# Patient Record
Sex: Female | Born: 1980 | Race: Black or African American | Hispanic: No | Marital: Married | Smoking: Never smoker
Health system: Southern US, Community
[De-identification: ages and names within clinical notes are randomized; demographics above are authoritative.]

## PROBLEM LIST (undated history)

## (undated) DIAGNOSIS — L03221 Cellulitis of neck: Secondary | ICD-10-CM

## (undated) HISTORY — DX: Cellulitis of neck: L03.221

---

## 2005-06-20 ENCOUNTER — Inpatient Hospital Stay: Payer: Self-pay

## 2010-07-11 ENCOUNTER — Ambulatory Visit: Payer: Self-pay | Admitting: Family Medicine

## 2012-07-13 ENCOUNTER — Ambulatory Visit: Payer: Self-pay

## 2012-07-23 DIAGNOSIS — L03019 Cellulitis of unspecified finger: Secondary | ICD-10-CM

## 2012-07-23 DIAGNOSIS — L02519 Cutaneous abscess of unspecified hand: Secondary | ICD-10-CM | POA: Insufficient documentation

## 2014-07-19 ENCOUNTER — Other Ambulatory Visit: Payer: Self-pay | Admitting: Obstetrics and Gynecology

## 2014-07-19 DIAGNOSIS — Z1231 Encounter for screening mammogram for malignant neoplasm of breast: Secondary | ICD-10-CM

## 2014-08-03 ENCOUNTER — Other Ambulatory Visit: Payer: Self-pay | Admitting: Obstetrics and Gynecology

## 2014-08-03 ENCOUNTER — Ambulatory Visit
Admission: RE | Admit: 2014-08-03 | Discharge: 2014-08-03 | Disposition: A | Discharge status: Home / Self Care | Payer: Managed Care, Other (non HMO) | Source: Ambulatory Visit | Attending: Obstetrics and Gynecology | Admitting: Obstetrics and Gynecology

## 2014-08-03 ENCOUNTER — Ambulatory Visit
Admission: RE | Admit: 2014-08-03 | Discharge: 2014-08-03 | Disposition: A | Discharge status: Home / Self Care | Payer: Managed Care, Other (non HMO) | Source: Ambulatory Visit | Attending: Nurse Practitioner | Admitting: Nurse Practitioner

## 2014-08-03 DIAGNOSIS — Z1231 Encounter for screening mammogram for malignant neoplasm of breast: Secondary | ICD-10-CM

## 2017-03-26 ENCOUNTER — Other Ambulatory Visit: Payer: Self-pay | Admitting: Family Medicine

## 2017-03-26 DIAGNOSIS — R221 Localized swelling, mass and lump, neck: Secondary | ICD-10-CM

## 2017-04-08 ENCOUNTER — Ambulatory Visit
Admission: RE | Admit: 2017-04-08 | Discharge: 2017-04-08 | Disposition: A | Discharge status: Home / Self Care | Payer: Commercial Managed Care - PPO | Source: Ambulatory Visit | Attending: Family Medicine | Admitting: Family Medicine

## 2017-04-08 DIAGNOSIS — R221 Localized swelling, mass and lump, neck: Secondary | ICD-10-CM | POA: Insufficient documentation

## 2017-04-08 DIAGNOSIS — Z0000 Encounter for general adult medical examination without abnormal findings: Secondary | ICD-10-CM | POA: Insufficient documentation

## 2017-04-15 ENCOUNTER — Other Ambulatory Visit: Payer: Self-pay | Admitting: Family Medicine

## 2017-04-15 DIAGNOSIS — R221 Localized swelling, mass and lump, neck: Secondary | ICD-10-CM

## 2017-04-24 ENCOUNTER — Ambulatory Visit
Admission: RE | Admit: 2017-04-24 | Discharge: 2017-04-24 | Disposition: A | Discharge status: Home / Self Care | Payer: Commercial Managed Care - PPO | Source: Ambulatory Visit | Attending: Family Medicine | Admitting: Family Medicine

## 2017-04-24 DIAGNOSIS — R221 Localized swelling, mass and lump, neck: Secondary | ICD-10-CM | POA: Diagnosis present

## 2017-04-24 DIAGNOSIS — I871 Compression of vein: Secondary | ICD-10-CM | POA: Insufficient documentation

## 2017-04-24 MED ORDER — IOPAMIDOL (ISOVUE-300) INJECTION 61%
75.0000 mL | Freq: Once | INTRAVENOUS | Status: AC | PRN
  Administered 2017-04-24: 75 mL via INTRAVENOUS

## 2017-05-02 ENCOUNTER — Telehealth: Payer: Self-pay | Admitting: Surgery

## 2017-05-02 NOTE — Telephone Encounter (Signed)
Left a voicemail for the patient to call our office, needs an appointment with Dr. Rosana Hoes next week.

## 2017-05-02 NOTE — Telephone Encounter (Signed)
-----   Message from Vickie Epley, MD sent at 05/01/2017 11:18 PM EST ----- Regarding: FW: Patient with neck mass Stephanie Stafford from [facility] asked if I can see this patient he is scheduled to see on Monday. Can we schedule and call her?  She needs neck MRI and possibly Left neck venous duplex, but we can schedule at appointment. Please let me know if any questions.  Thank you!            - Corene Cornea   ----- Message ----- From: Stephanie Pun, MD Sent: 05/01/2017   3:00 PM To: Vickie Epley, MD Subject: Patient with neck mass                         Patient with neck mass

## 2017-05-05 NOTE — Telephone Encounter (Signed)
Left message on patients voice mail to call office regarding scheduling appointment for neck mass.

## 2017-05-06 NOTE — Telephone Encounter (Signed)
Left another message on patients voicemail to call the office to setup an appointment to be seen.

## 2017-05-07 NOTE — Telephone Encounter (Signed)
Patients coming in on 05/16/17

## 2017-05-16 ENCOUNTER — Ambulatory Visit (INDEPENDENT_AMBULATORY_CARE_PROVIDER_SITE_OTHER): Payer: Commercial Managed Care - PPO | Admitting: Surgery

## 2017-05-16 ENCOUNTER — Encounter: Payer: Self-pay | Admitting: Surgery

## 2017-05-16 VITALS — BP 139/87 | HR 76 | Temp 98.6°F | Ht 63.0 in | Wt 161.0 lb

## 2017-05-16 DIAGNOSIS — R221 Localized swelling, mass and lump, neck: Secondary | ICD-10-CM

## 2017-05-16 NOTE — Patient Instructions (Addendum)
We will contact you with an appointment for further testing prior to having your surgery with Dr. Rosana Hoes.  Once these have been completed we will see you back in office to discuss result and plan for surgery.   If you do not hear from our office within the next 2-3 business days please give our office a call.  If you have any questions or concerns please give our office a call as well.

## 2017-05-16 NOTE — Progress Notes (Signed)
Surgical Clinic History and Physical  Referring provider:  No referring provider defined for this encounter.  HISTORY OF PRESENT ILLNESS (HPI):  37 y.o. otherwise healthy female presents for evaluation of Left neck mass. Patient reports she first noticed the otherwise non-tender and asymptomatic mass 3 - 4 months ago, since which time it has grown to be more noticeable. Patient has since underwent ultrasound and CT imaging and denies fever/chills, weight loss, palpitations, lightheadedness, dizziness, dysphasia, voice changes, CP, or SOB.  PAST MEDICAL HISTORY (PMH):  Past Medical History:  Diagnosis Date  . Cellulitis of neck      PAST SURGICAL HISTORY ([place]):  Past Surgical History:  Procedure Laterality Date  . CESAREAN SECTION       MEDICATIONS:  Prior to Admission medications   Not on File     ALLERGIES:  No Known Allergies   SOCIAL HISTORY:  Social History   Socioeconomic History  . Marital status: Married    Spouse name: Not on file  . Number of children: Not on file  . Years of education: Not on file  . Highest education level: Not on file  Social Needs  . Financial resource strain: Not on file  . Food insecurity - worry: Not on file  . Food insecurity - inability: Not on file  . Transportation needs - medical: Not on file  . Transportation needs - non-medical: Not on file  Occupational History  . Not on file  Tobacco Use  . Smoking status: Never Smoker  . Smokeless tobacco: Never Used  Substance and Sexual Activity  . Alcohol use: No    Frequency: Never  . Drug use: No  . Sexual activity: Not on file  Other Topics Concern  . Not on file  Social History Narrative  . Not on file    The patient currently resides (home / rehab facility / nursing home): Home The patient normally is (ambulatory / bedbound): Ambulatory  FAMILY HISTORY:  Family History  Problem Relation Age of Onset  . Breast cancer Maternal Aunt   . Healthy Mother   . Thyroid  disease Brother   . Lung cancer Maternal Grandfather     Otherwise negative/non-contributory.  REVIEW OF SYSTEMS:  Constitutional: denies any other weight loss, fever, chills, or sweats  Eyes: denies any other vision changes, history of eye injury  ENT: denies sore throat, hearing problems  Respiratory: denies shortness of breath, wheezing  Cardiovascular: denies chest pain, palpitations  Gastrointestinal: denies abdominal pain, N/V, or diarrhea Musculoskeletal: denies any other joint pains or cramps  Skin: Denies any other rashes or skin discolorations Neurological: denies any other headache, dizziness, weakness  Psychiatric: Denies any other depression, anxiety   All other review of systems were otherwise negative   VITAL SIGNS:  BP 139/87   Pulse 76   Temp 98.6 F (37 C) (Oral)   Ht 5\' 3"  (1.6 m)   Wt 161 lb (73 kg)   BMI 28.52 kg/m    PHYSICAL EXAM:  Constitutional:  -- Normal body habitus  -- Awake, alert, and oriented x3  Eyes:  -- Pupils equally round and reactive to light  -- No scleral icterus  Ear, nose, throat:  -- No jugular venous distension -- Easily palpable non-tender 2 - 3 cm Left neck mass anterior to and mid-way along the patient's SCM -- No nasal drainage, bleeding Pulmonary:  -- No crackles  -- Equal breath sounds bilaterally -- Breathing non-labored at rest Cardiovascular:  -- S1, S2 present  --  No pericardial rubs  Gastrointestinal:  -- Abdomen soft, nontender, non-distended, no guarding/rebound  -- No abdominal masses appreciated, pulsatile or otherwise  Musculoskeletal and Integumentary:  -- Wounds or skin discoloration: None appreciated -- Extremities: B/L UE and LE FROM, hands and feet warm, no edema  Neurologic:  -- CN II - XII grossly intact and symmetric -- Motor function: Intact and symmetric -- Sensation: Intact and symmetric  Labs: CBC: No results found for: WBC, RBC BMP: No results found for: GLUCOSE, POTASSIUM, CHLORIDE,  CO2, BUN, CREATININE, CALCIUM   Imaging studies:  Left Neck Soft Tissue Ultrasound (04/08/2017) The palpable mass corresponds to a 2.4 x 3.8 x 1.7 cm slightly hypoechoic, and ill-defined mass in the subcutaneous fat. Color Doppler imaging demonstrates some internal vascularity.  CT Neck with Contrast (04/24/2017) - personally reviewed and discussed with patient at length 1. Soft tissue mass of the left carotid space extending from approximately the C1 level the inferiorly to the level of the thyroid cartilage. Primary differential considerations based on the location are schwannoma and, less likely, paraganglioma. In the absence of associated pain, carotidynia and vasculitis are unlikely. MRI of the neck with and without contrast is recommended. 2. Compression or occlusion of the left internal jugular vein by the above-described mass. 3. No other neck mass.  No lymphadenopathy.  Assessment/Plan:  37 y.o. 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   - will check neck MRI with and without contrast   - also will check Left neck venous doppler to assess for DVT (compression vs thrombosis)   - direct laryngoscopic vocal cord evaluation by ENT requested pre-operatively to assess function  - general preliminary risks, benefits, and alternatives to surgical management of Left neck mass, particularly for anticipated schwannoma, were discussed with the patient, and all of her questions were answered to her expressed satisfaction, though will defer consent until follow-up imaging and laryngoscopy   - will further discuss and anticipate scheduling surgical resection/enucleation with intra-operative nerve monitoring at follow-up appointment to discuss results of further imaging and workup  - return to clinic upon completion of above further workup  -  instructed to call if any questions or concerns  All of the above recommendations were discussed with the patient, and all of patient's questions were answered to her expressed satisfaction.  Thank you for the opportunity to participate in this patient's care.  -- Marilynne Drivers Rosana Hoes, MD, [place]: [facility] - Partnering for exceptional care. Office: [phone]

## 2017-05-19 ENCOUNTER — Telehealth: Payer: Self-pay

## 2017-05-19 NOTE — Telephone Encounter (Signed)
Called patient but had to leave her a voicemail letting her know of her appointments. I will send her the information by mail as well.

## 2017-05-19 NOTE — Addendum Note (Signed)
Addended by: Wayna Chalet on: 05/19/2017 08:55 AM   Modules accepted: Orders

## 2017-05-20 NOTE — Addendum Note (Signed)
Addended by: Wayna Chalet on: 05/20/2017 08:22 AM   Modules accepted: Orders

## 2017-05-22 ENCOUNTER — Ambulatory Visit: Payer: Commercial Managed Care - PPO

## 2017-05-22 ENCOUNTER — Telehealth: Payer: Self-pay

## 2017-05-22 NOTE — Telephone Encounter (Signed)
Patient called and left a message on our answering machine stating that she is not able to miss work.  Georgina Peer then called patient back and gave her the number to call central scheduling to reschedule. Patient stated that she would call.

## 2017-05-27 NOTE — Telephone Encounter (Signed)
Error

## 2017-05-28 ENCOUNTER — Ambulatory Visit: Payer: Commercial Managed Care - PPO

## 2017-05-28 ENCOUNTER — Ambulatory Visit: Payer: Self-pay | Admitting: Surgery

## 2017-06-02 ENCOUNTER — Ambulatory Visit
Admission: RE | Admit: 2017-06-02 | Discharge: 2017-06-02 | Disposition: A | Discharge status: Home / Self Care | Payer: Commercial Managed Care - PPO | Source: Ambulatory Visit | Attending: Surgery | Admitting: Surgery

## 2017-06-02 DIAGNOSIS — R221 Localized swelling, mass and lump, neck: Secondary | ICD-10-CM

## 2017-06-02 MED ORDER — GADOBENATE DIMEGLUMINE 529 MG/ML IV SOLN
15.0000 mL | Freq: Once | INTRAVENOUS | Status: AC | PRN
  Administered 2017-06-02: 15 mL via INTRAVENOUS

## 2017-06-03 ENCOUNTER — Telehealth: Payer: Self-pay

## 2017-06-03 NOTE — Telephone Encounter (Signed)
Called patient but had to leave her a voicemail to return my call. Patient needs to see Dr. Rosana Hoes.  If patient calls, please schedule her an appointment with Dr. Rosana Hoes for 06/04/2017 at 8:45 AM to discuss MRI and U/S results. Thanks.

## 2017-06-03 NOTE — Telephone Encounter (Signed)
Spoke with patient and told her we needed to see her tomorrow, patient stated she wouldn't be able to take off of work with very short notice, patient said she would call back to schedule appointment.

## 2017-06-03 NOTE — Telephone Encounter (Signed)
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 I can follow up with her to discuss results, but she needs to be referred for ultrasound-guided biopsy (IR) and referred to either ENT or [place] if they're comfortable/interested or more likely [facility] or [facility].  Please let me know if any questions. Thank you.          Corene Cornea

## 2017-06-05 ENCOUNTER — Telehealth: Payer: Self-pay | Admitting: Surgery

## 2017-06-05 NOTE — Telephone Encounter (Signed)
I have contacted [facility] to follow up on patient's referral. Patient was given an appointment to be seen on 3/27. Patient did not attend appointment. [facility] has mailed the patient a letter. I have also called patient and left a message on voicemail to call [facility] to reschedule.

## 2017-06-10 NOTE — Telephone Encounter (Signed)
Patient is coming in tomorrow to see Dr. Rosana Hoes to discuss her images results.

## 2017-06-11 ENCOUNTER — Telehealth: Payer: Self-pay

## 2017-06-11 ENCOUNTER — Other Ambulatory Visit: Payer: Self-pay | Admitting: Internal Medicine

## 2017-06-11 ENCOUNTER — Encounter: Payer: Self-pay | Admitting: Surgery

## 2017-06-11 ENCOUNTER — Ambulatory Visit (INDEPENDENT_AMBULATORY_CARE_PROVIDER_SITE_OTHER): Payer: Commercial Managed Care - PPO | Admitting: Surgery

## 2017-06-11 VITALS — BP 139/88 | HR 79 | Temp 98.4°F | Ht 63.0 in | Wt 158.4 lb

## 2017-06-11 DIAGNOSIS — R221 Localized swelling, mass and lump, neck: Secondary | ICD-10-CM | POA: Diagnosis not present

## 2017-06-11 NOTE — Telephone Encounter (Signed)
Order placed for IR neck mass biopsy. IMG [id number]. Spoke with Pamala Hurry in scheduling and the request has been sent for review. She will call me back and let me know when the appointment will be.

## 2017-06-11 NOTE — Progress Notes (Signed)
Surgical Clinic Progress/Follow-up Note   HPI:  37 y.o. Female presents to clinic for post-op follow-up evaluation of Left neck mass and discussion regarding recent MRI results. Patient continues to report essentially asymptomatic Left neck mass, denies any Left neck pain, dysphasia, altered voice, N/V, facial swelling, paresthesias, fever/chills, CP, or SOB.  Review of Systems:  Constitutional: denies any other weight loss, fever, chills, or sweats  Eyes: denies any other vision changes, history of eye injury  ENT: denies sore throat, hearing problems  Respiratory: denies shortness of breath, wheezing  Cardiovascular: denies chest pain, palpitations  Gastrointestinal: denies abdominal pain, N/V, or diarrhea Musculoskeletal: denies any other joint pains or cramps  Skin: Denies any other rashes or skin discolorations  Neurological: denies any other headache, dizziness, weakness  Psychiatric: denies any other depression, anxiety  All other review of systems: otherwise negative   Vital Signs:  BP 139/88   Pulse 79   Temp 98.4 F (36.9 C) (Oral)   Ht 5\' 3"  (1.6 m)   Wt 158 lb 6.4 oz (71.8 kg)   LMP 05/12/2017   BMI 28.06 kg/m    Physical Exam:  Constitutional:  -- Normal body habitus  -- Awake, alert, and oriented x3  Eyes:  -- Pupils equally round and reactive to light  -- No scleral icterus  Ear, nose, throat:  -- No jugular venous distension -- Firm non-tender non-pulsatile Left neck mass, uncertain whether changed in size -- No nasal drainage, bleeding Pulmonary:  -- No crackles -- Equal breath sounds bilaterally -- Breathing non-labored at rest Cardiovascular:  -- S1, S2 present  -- No pericardial rubs  Gastrointestinal:  -- Soft, nontender, non-distended, no guarding/rebound  -- No abdominal masses appreciated, pulsatile or otherwise  Musculoskeletal / Integumentary:  -- Wounds or skin discoloration: None appreciated  -- Extremities: B/L UE and LE FROM, hands  and feet warm, no edema  Neurologic:  -- Motor function: intact and symmetric  -- Sensation: intact and symmetric   Imaging:  MRI Neck with and without Contrast (06/02/2017) - images and interpretation personally reviewed with patient and her boyfriend/husband 9 cm left neck mass primarily involving the carotid space as above. This does not have the typical appearance of a schwannoma or paraganglioma. A soft tissue sarcoma and desmoid-type fibromatosis are considerations, and tissue sampling is recommended. The internal jugular vein is either completely compressed or directly involved by the mass.   Assessment:  37 y.o. yo Female with a problem list including...  Patient Active Problem List   Diagnosis Date Noted  . Cellulitis and abscess of finger 07/23/2012    presents to clinic for essentially asymptomatic Left neck mass, concerning on recent MRI for sarcoma.  Plan:   - results of MRI reviewed with patient and her husband/boyfried  - image-guided biopsy of Left neck mass advised and will schedule  - further management and follow-up to be determined following above  - all of patient's and her boyfriend/husband's questioned answered  - instructed to call office if any questions or concerns  - will also plan to discuss at tumor board tomorrow  All of the above recommendations were discussed with the patient and patient's family, and all of patient's and family's questions were answered to their expressed satisfaction.  -- Marilynne Drivers Rosana Hoes, MD, [place]: [facility] - Partnering for exceptional care. Office: [phone]

## 2017-06-11 NOTE — Patient Instructions (Signed)
We will call you later with an appointment time and date to have the biopsy done. If you have not heard from [place] by Friday before lunch please call our office and we can check on this for you.   [phone]

## 2017-06-13 NOTE — Telephone Encounter (Signed)
Patient notified of 06/23/17 @ 10:30 am Neck mass biopsy.  Follow up Dr.Davis 07/02/17 @ 10:30 am.

## 2017-06-13 NOTE — Telephone Encounter (Signed)
Left message for patient to return call.  Neck Mass Biopsy 06/23/17 @ 10:00 [facility] Entrance  Stop at Registration Desk to register.  A nurse will call patient several days prior to having surgery with prep information.

## 2017-06-13 NOTE — Telephone Encounter (Signed)
Left message for patient to return call regarding Neck mass biopsy. Spoke to [place] and it was discussed at tumor board and she is waiting on Radiologist to let her know when it can be scheduled.

## 2017-06-20 ENCOUNTER — Other Ambulatory Visit: Payer: Self-pay | Admitting: Radiology

## 2017-06-23 ENCOUNTER — Ambulatory Visit
Admission: RE | Admit: 2017-06-23 | Discharge: 2017-06-23 | Disposition: A | Discharge status: Home / Self Care | Payer: Commercial Managed Care - PPO | Source: Ambulatory Visit | Attending: Surgery | Admitting: Surgery

## 2017-06-23 ENCOUNTER — Other Ambulatory Visit: Payer: Self-pay | Admitting: Surgery

## 2017-06-23 DIAGNOSIS — R221 Localized swelling, mass and lump, neck: Secondary | ICD-10-CM | POA: Diagnosis present

## 2017-06-23 MED ORDER — SODIUM CHLORIDE 0.9 % IV SOLN: INTRAVENOUS | Status: DC

## 2017-06-23 NOTE — Procedures (Signed)
Interventional Radiology Procedure Note  Procedure: Patient presents with left neck mass of the carotid space, for possible bx.   Findings: real time US shows infiltrative soft tissue of the left carotid space, with internal flow, mixed venous and arterial architecture.  Limited window for biopsy  Discussed with the patient, and my impression is that given the risk of significant hemorrhage and her upcoming referral to [facility], would defer bx for now, possibly for surgical bx.   . Recommendations:  - Bx was deferred at this time, as above findings  Signed,  Dulcy Fanny. Earleen Newport, DO

## 2017-06-30 ENCOUNTER — Telehealth: Payer: Self-pay | Admitting: Surgery

## 2017-06-30 NOTE — Telephone Encounter (Signed)
I have called patient to advise of appointment-No answer. I have left a message on voicemail for patient to call back for information below.   Patient has been referred to [facility] for Neck Biopsy/Consultation.  Appt: 07/04/17 @ 9:00am-Dr Blumberg--[facility]-[address] [place]. Patient will need to arrive 30 minutes earlier and also be mindful of new construction going on. Patient will need to park in the visitors parking deck and the [place] entrance is on the right side of the main campus.   Nevin Bloodgood has sent the provider an email to review patient's clinic notes incase he would like to consult sooner or to schedule a Bx the day of appointment.   Per Paula-they do have access to all clinic notes in Epic and Images.

## 2017-06-30 NOTE — Telephone Encounter (Signed)
Patient has called back and all information has been relayed. Patient was ok with this appointment.

## 2017-06-30 NOTE — Telephone Encounter (Signed)
Please call patient in reference to [facility] referral

## 2017-07-01 ENCOUNTER — Telehealth: Payer: Self-pay

## 2017-07-01 NOTE — Telephone Encounter (Signed)
Left message for patient to call office.  We can cancel appointment for 07/02/17 with Dr.Davis since patient will be seen at [facility] for neck biopsy.

## 2017-07-02 ENCOUNTER — Ambulatory Visit: Payer: Self-pay | Admitting: Surgery

## 2018-12-05 IMAGING — US US SOFT TISSUE HEAD/NECK
1 series · 2 of 2 positions shown · non-contrast
Comparison: MRI 06/02/2017, CT 04/24/2017

CLINICAL DATA: 29-year-old female with a history of left neck mass
referred for possible biopsy

EXAM:
ULTRASOUND OF HEAD/NECK SOFT TISSUES
TECHNIQUE: Ultrasound examination of the head and neck soft tissues was
performed in the area of clinical concern.

[Series 1: us soft tissue head/neck · 2 of 2 slices shown]
[im 1/2]
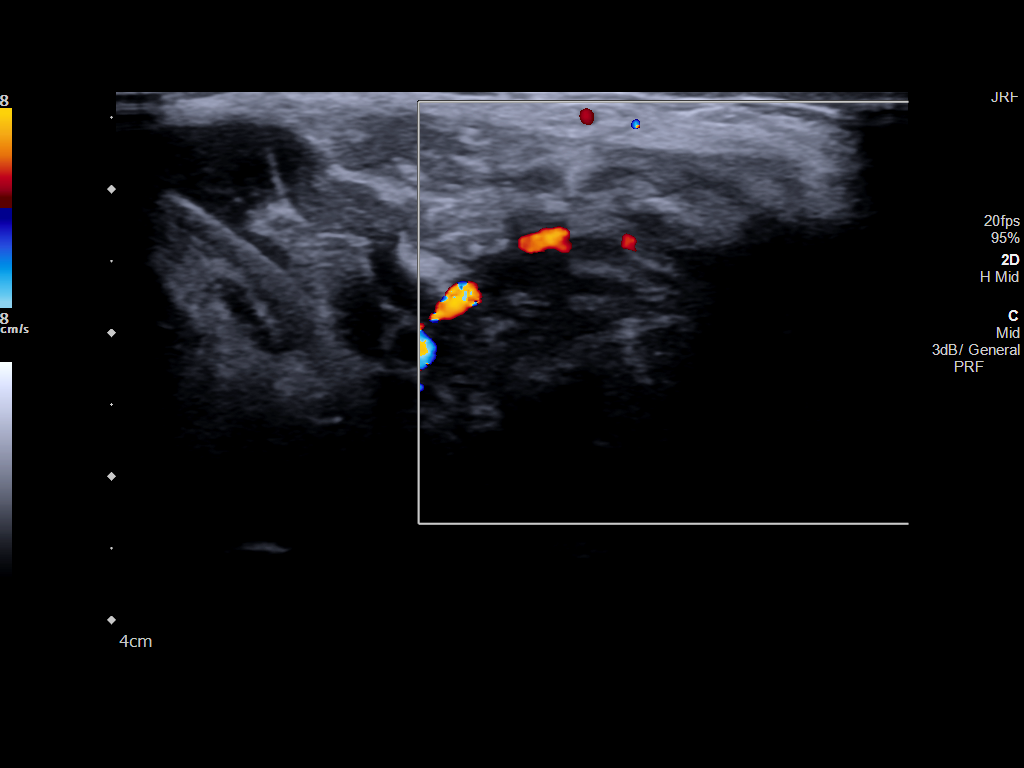
[im 2/2]
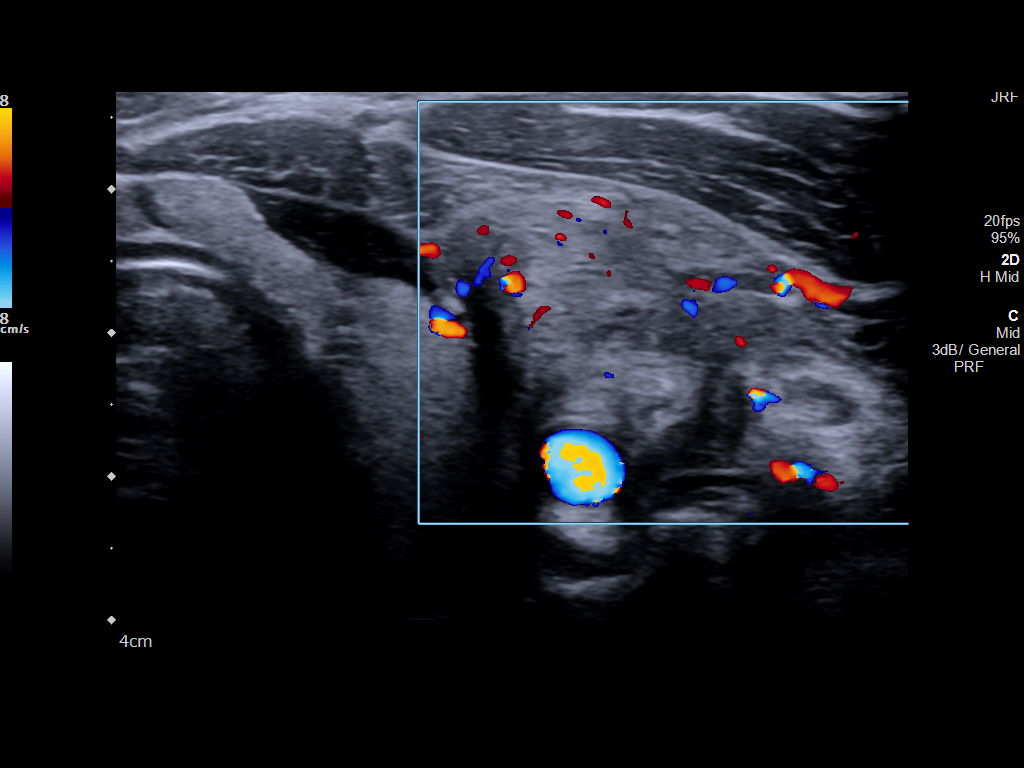

[2 of 2 positions shown; findings below may reference images not displayed]

FINDINGS: Color and grayscale duplex ultrasound performed as a planning study
demonstrates infiltrative soft tissue within the left carotid space
with significant internal venous and arterial signature.

Biopsy was deferred at this time.
IMPRESSION: Soft tissue ultrasound survey for potential percutaneous biopsy
demonstrates infiltrative soft tissue of the left carotid space with
internal venous and arterial signature. Biopsy deferred at this
time.

## 2021-12-11 ENCOUNTER — Emergency Department (HOSPITAL_COMMUNITY)
Admission: EM | Admit: 2021-12-11 | Discharge: 2021-12-11 | Disposition: A | Discharge status: Home / Self Care | Payer: Commercial Managed Care - PPO | Attending: Emergency Medicine | Admitting: Emergency Medicine

## 2021-12-11 ENCOUNTER — Emergency Department (HOSPITAL_COMMUNITY): Payer: Commercial Managed Care - PPO

## 2021-12-11 ENCOUNTER — Encounter (HOSPITAL_COMMUNITY): Payer: Self-pay

## 2021-12-11 ENCOUNTER — Other Ambulatory Visit: Payer: Self-pay

## 2021-12-11 DIAGNOSIS — R030 Elevated blood-pressure reading, without diagnosis of hypertension: Secondary | ICD-10-CM | POA: Diagnosis not present

## 2021-12-11 DIAGNOSIS — D259 Leiomyoma of uterus, unspecified: Secondary | ICD-10-CM | POA: Insufficient documentation

## 2021-12-11 DIAGNOSIS — N3001 Acute cystitis with hematuria: Secondary | ICD-10-CM

## 2021-12-11 DIAGNOSIS — R1031 Right lower quadrant pain: Secondary | ICD-10-CM | POA: Diagnosis present

## 2021-12-11 DIAGNOSIS — D219 Benign neoplasm of connective and other soft tissue, unspecified: Secondary | ICD-10-CM

## 2021-12-11 DIAGNOSIS — N83201 Unspecified ovarian cyst, right side: Secondary | ICD-10-CM | POA: Diagnosis not present

## 2021-12-11 DIAGNOSIS — R911 Solitary pulmonary nodule: Secondary | ICD-10-CM

## 2021-12-11 DIAGNOSIS — Z20822 Contact with and (suspected) exposure to covid-19: Secondary | ICD-10-CM | POA: Insufficient documentation

## 2021-12-11 DIAGNOSIS — N838 Other noninflammatory disorders of ovary, fallopian tube and broad ligament: Secondary | ICD-10-CM

## 2021-12-11 LAB — COMPREHENSIVE METABOLIC PANEL
ALT: 22 U/L (ref 0–44)
AST: 19 U/L (ref 15–41)
Albumin: 3.8 g/dL (ref 3.5–5.0)
Alkaline Phosphatase: 51 U/L (ref 38–126)
Anion gap: 5 (ref 5–15)
BUN: 9 mg/dL (ref 6–20)
CO2: 25 mmol/L (ref 22–32)
Calcium: 8.8 mg/dL — ABNORMAL LOW (ref 8.9–10.3)
Chloride: 105 mmol/L (ref 98–111)
Creatinine, Ser: 0.77 mg/dL (ref 0.44–1.00)
GFR, Estimated: >60 mL/min (ref >60)
Glucose, Bld: 105 mg/dL — ABNORMAL HIGH (ref 70–99)
Potassium: 4 mmol/L (ref 3.5–5.1)
Sodium: 135 mmol/L (ref 135–145)
Total Bilirubin: 0.5 mg/dL (ref 0.3–1.2)
Total Protein: 8 g/dL (ref 6.5–8.1)

## 2021-12-11 LAB — CBC
HCT: 39.3 % (ref 36.0–46.0)
Hemoglobin: 12.8 g/dL (ref 12.0–15.0)
MCH: 28.1 pg (ref 26.0–34.0)
MCHC: 32.6 g/dL (ref 30.0–36.0)
MCV: 86.2 fL (ref 80.0–100.0)
Platelets: 279 10*3/uL (ref 150–400)
RBC: 4.56 MIL/uL (ref 3.87–5.11)
RDW: 12.9 % (ref 11.5–15.5)
WBC: 7.7 10*3/uL (ref 4.0–10.5)
nRBC: 0 % (ref 0.0–0.2)

## 2021-12-11 LAB — URINALYSIS, ROUTINE W REFLEX MICROSCOPIC
Bilirubin Urine: NEGATIVE
Glucose, UA: NEGATIVE mg/dL
Ketones, ur: NEGATIVE mg/dL
Nitrite: NEGATIVE
Protein, ur: NEGATIVE mg/dL
Specific Gravity, Urine: 1.011 (ref 1.005–1.030)
pH: 6 (ref 5.0–8.0)

## 2021-12-11 LAB — I-STAT BETA HCG BLOOD, ED (MC, WL, AP ONLY): I-stat hCG, quantitative: <5 m[IU]/mL (ref <5)

## 2021-12-11 LAB — RESP PANEL BY RT-PCR (FLU A&B, COVID) ARPGX2
Influenza A by PCR: NEGATIVE
Influenza B by PCR: NEGATIVE
SARS Coronavirus 2 by RT PCR: NEGATIVE

## 2021-12-11 LAB — LIPASE, BLOOD: Lipase: 40 U/L (ref 11–51)

## 2021-12-11 MED ORDER — IOHEXOL 300 MG/ML  SOLN
100.0000 mL | Freq: Once | INTRAMUSCULAR | Status: AC | PRN
  Administered 2021-12-11: 100 mL via INTRAVENOUS

## 2021-12-11 MED ORDER — CEPHALEXIN 500 MG PO CAPS: 500.0000 mg | ORAL_CAPSULE | Freq: Two times a day (BID) | ORAL | 0 refills | Status: AC

## 2021-12-11 MED ORDER — SODIUM CHLORIDE (PF) 0.9 % IJ SOLN
INTRAMUSCULAR | Status: AC
  Filled 2021-12-11: qty 50

## 2021-12-11 NOTE — Discharge Instructions (Addendum)
At this time there does not appear to be the presence of an emergent medical condition, however there is always the potential for conditions to change. Please read and follow the below instructions.  Please return to the Emergency Department immediately for any new or worsening symptoms. Please be sure to follow up with your Primary Care Provider within one week regarding your visit today; please call their office to schedule an appointment even if you are feeling better for a follow-up visit. Please take your antibiotic keflex as prescribed until complete to help treat urinary tract infection.  Please drink enough water to avoid dehydration and get plenty of rest. Your CT scan today showed consolidation in your lungs this could reflect an infection or scarring from a previous infection.  Your COVID and flu test are currently pending this should result in the next 24 hours.  Please check your MyChart account for those results and discuss them with your primary care provider.  Your CT scan also showed a nodule in your right middle lung, please discuss this with your primary care provider as a follow-up picture of your chest will be needed. Your ultrasound today showed a 5 cm complex cyst on your right ovary, this will need further evaluation by your OB/GYN.  Please call your OB/GYN today to schedule follow-up appointment.  Your ultrasound also showed fibroids in your uterus. Your blood pressure was elevated in the emergency department today.  Please have your blood pressure rechecked by your primary care provider at your follow-up appointment.   Please read the additional information packets attached to your discharge summary.  Go to the nearest Emergency Department immediately if: You have fever or chills You have very bad back pain. You have very bad pain in your lower belly. You have a fever. You have chills. You feeling like you will vomit or you vomit. You have chest pain or shortness of  breath You have any new/concerning or worsening of symptoms  Do not take your medicine if  develop an itchy rash, swelling in your mouth or lips, or difficulty breathing; call 911 and seek immediate emergency medical attention if this occurs.  You may review your lab tests and imaging results in their entirety on your MyChart account.  Please discuss all results of fully with your primary care provider and other specialist at your follow-up visit.  Note: Portions of this text may have been transcribed using voice recognition software. Every effort was made to ensure accuracy; however, inadvertent computerized transcription errors may still be present.

## 2021-12-11 NOTE — ED Provider Notes (Signed)
[facility] DEPT Provider Note   CSN: [id number] Arrival date & time: 12/11/21  0451     History  Chief Complaint  Patient presents with   Urinary Frequency   Dysuria    Stephanie Stafford is a 41 y.o. female presented with her husband today for evaluation of abdominal pain.  Patient reports that yesterday she noticed a mild burning sensation when she was urinating, this has improved however she noticed that overnight she developed right lower quadrant abdominal pain.  She describes it as a sharp pain that is moderate intensity worsened with palpation, no alleviating factors, pain does not radiate and is moderate in intensity.  She denies similar pain in the past.  Patient denies fever, chills, vomiting/diarrhea, vaginal bleeding/discharge or any additional concerns.  HPI     Home Medications Prior to Admission medications   Medication Sig Start Date End Date Taking? Authorizing Provider  cephALEXin (KEFLEX) 500 MG capsule Take 1 capsule (500 mg total) by mouth 2 (two) times daily for 7 days. 12/11/21 12/18/21 Yes Nuala Alpha A, PA-C      Allergies    Patient has no known allergies.    Review of Systems   Review of Systems Ten systems are reviewed and are negative for acute change except as noted in the HPI  Physical Exam Updated Vital Signs BP (!) 173/100 (BP Location: Left Arm)   Pulse 74   Temp 98.6 F (37 C) (Oral)   Resp 18   Ht '5\' 3"'$  (1.6 m)   Wt 74.8 kg   LMP 11/22/2021   SpO2 100%   BMI 29.23 kg/m  Physical Exam Constitutional:      General: She is not in acute distress.    Appearance: Normal appearance. She is well-developed. She is not ill-appearing or diaphoretic.  HENT:     Head: Normocephalic and atraumatic.  Eyes:     General: Vision grossly intact. Gaze aligned appropriately.     Pupils: Pupils are equal, round, and reactive to light.  Neck:     Trachea: Trachea and phonation normal.  Pulmonary:     Effort:  Pulmonary effort is normal. No respiratory distress.  Abdominal:     General: There is no distension.     Palpations: Abdomen is soft.     Tenderness: There is abdominal tenderness in the right lower quadrant. There is no guarding or rebound. Positive signs include McBurney's sign. Negative signs include Murphy's sign.  Musculoskeletal:        General: Normal range of motion.     Cervical back: Normal range of motion.  Skin:    General: Skin is warm and dry.  Neurological:     Mental Status: She is alert.     GCS: GCS eye subscore is 4. GCS verbal subscore is 5. GCS motor subscore is 6.     Comments: Speech is clear and goal oriented, follows commands Major Cranial nerves without deficit, no facial droop Moves extremities without ataxia, coordination intact  Psychiatric:        Behavior: Behavior normal.     ED Results / Procedures / Treatments   Labs (all labs ordered are listed, but only abnormal results are displayed) Labs Reviewed  URINALYSIS, ROUTINE W REFLEX MICROSCOPIC - Abnormal; Notable for the following components:      Result Value   Color, Urine STRAW (*)    Hgb urine dipstick MODERATE (*)    Leukocytes,Ua TRACE (*)    Bacteria, UA RARE (*)  All other components within normal limits  COMPREHENSIVE METABOLIC PANEL - Abnormal; Notable for the following components:   Glucose, Bld 105 (*)    Calcium 8.8 (*)    All other components within normal limits  URINE CULTURE  RESP PANEL BY RT-PCR (FLU A&B, COVID) ARPGX2  CBC  LIPASE, BLOOD  I-STAT BETA HCG BLOOD, ED ([facility], [facility], [facility] ONLY)    EKG None  Radiology US Pelvis Complete  Result Date: 12/11/2021 CLINICAL DATA:  Right lower quadrant pain. EXAM: TRANSABDOMINAL AND TRANSVAGINAL ULTRASOUND OF PELVIS DOPPLER ULTRASOUND OF OVARIES TECHNIQUE: Both transabdominal and transvaginal ultrasound examinations of the pelvis were performed. Transabdominal technique was performed for global imaging of the pelvis including uterus,  ovaries, adnexal regions, and pelvic cul-de-sac. It was necessary to proceed with endovaginal exam following the transabdominal exam to visualize the endometrium and ovaries. Color and duplex Doppler ultrasound was utilized to evaluate blood flow to the ovaries. COMPARISON:  CT 12/11/2021 FINDINGS: Uterus Measurements: 12.0 x 5.6 x 6.6 cm = volume: 230 ML. 5.7 cm fibroid over the posterior lower uterine segment. Mild diffuse heterogeneity is difficult to identify additional smaller fibroids as seen on CT. Endometrium Thickness: 12 mm.  No focal abnormality visualized. Right ovary Measurements: 4.5 x 5.5 x 4.8 cm = volume: 62 mL. 5.0 cm complex cystic mass with no significant internal vascularity. Mass contains multiple thin septations versus reticular pattern. This may represent a hemorrhagic cyst or endometrioma and less likely neoplasm. Left ovary Not visualized due to its high lateral position as seen on CT. Pulsed Doppler evaluation of the right ovary demonstrates normal low-resistance arterial and venous waveforms. Left ovary not visualized. Other findings No abnormal free fluid. IMPRESSION: 1. Normal size uterus with 5.7 cm fibroid over the posterior lower uterine segment. 2. 5.0 cm complex cystic right ovarian mass likely a hemorrhagic cyst or endometrioma and less likely neoplasm. Normal right ovarian blood flow without torsion. Recommend follow-up ultrasound 6 weeks. If unchanged at follow-up, would recommend gyn consultation and pelvic MRI for further evaluation. 3. Left ovary not visualized due to high lateral positioning. Therefore 2.6 cm hypodense mass seen on CT is not evaluated. Electronically Signed   By: Marin Olp M.D.   On: 12/11/2021 11:08   US Transvaginal Non-OB  Result Date: 12/11/2021 CLINICAL DATA:  Right lower quadrant pain. EXAM: TRANSABDOMINAL AND TRANSVAGINAL ULTRASOUND OF PELVIS DOPPLER ULTRASOUND OF OVARIES TECHNIQUE: Both transabdominal and transvaginal ultrasound examinations  of the pelvis were performed. Transabdominal technique was performed for global imaging of the pelvis including uterus, ovaries, adnexal regions, and pelvic cul-de-sac. It was necessary to proceed with endovaginal exam following the transabdominal exam to visualize the endometrium and ovaries. Color and duplex Doppler ultrasound was utilized to evaluate blood flow to the ovaries. COMPARISON:  CT 12/11/2021 FINDINGS: Uterus Measurements: 12.0 x 5.6 x 6.6 cm = volume: 230 ML. 5.7 cm fibroid over the posterior lower uterine segment. Mild diffuse heterogeneity is difficult to identify additional smaller fibroids as seen on CT. Endometrium Thickness: 12 mm.  No focal abnormality visualized. Right ovary Measurements: 4.5 x 5.5 x 4.8 cm = volume: 62 mL. 5.0 cm complex cystic mass with no significant internal vascularity. Mass contains multiple thin septations versus reticular pattern. This may represent a hemorrhagic cyst or endometrioma and less likely neoplasm. Left ovary Not visualized due to its high lateral position as seen on CT. Pulsed Doppler evaluation of the right ovary demonstrates normal low-resistance arterial and venous waveforms. Left ovary not visualized. Other findings  No abnormal free fluid. IMPRESSION: 1. Normal size uterus with 5.7 cm fibroid over the posterior lower uterine segment. 2. 5.0 cm complex cystic right ovarian mass likely a hemorrhagic cyst or endometrioma and less likely neoplasm. Normal right ovarian blood flow without torsion. Recommend follow-up ultrasound 6 weeks. If unchanged at follow-up, would recommend gyn consultation and pelvic MRI for further evaluation. 3. Left ovary not visualized due to high lateral positioning. Therefore 2.6 cm hypodense mass seen on CT is not evaluated. Electronically Signed   By: Marin Olp M.D.   On: 12/11/2021 11:08   [place] Art/Ven Flow Abd Pelv Doppler  Result Date: 12/11/2021 CLINICAL DATA:  Right lower quadrant pain. EXAM: TRANSABDOMINAL AND  TRANSVAGINAL ULTRASOUND OF PELVIS DOPPLER ULTRASOUND OF OVARIES TECHNIQUE: Both transabdominal and transvaginal ultrasound examinations of the pelvis were performed. Transabdominal technique was performed for global imaging of the pelvis including uterus, ovaries, adnexal regions, and pelvic cul-de-sac. It was necessary to proceed with endovaginal exam following the transabdominal exam to visualize the endometrium and ovaries. Color and duplex Doppler ultrasound was utilized to evaluate blood flow to the ovaries. COMPARISON:  CT 12/11/2021 FINDINGS: Uterus Measurements: 12.0 x 5.6 x 6.6 cm = volume: 230 ML. 5.7 cm fibroid over the posterior lower uterine segment. Mild diffuse heterogeneity is difficult to identify additional smaller fibroids as seen on CT. Endometrium Thickness: 12 mm.  No focal abnormality visualized. Right ovary Measurements: 4.5 x 5.5 x 4.8 cm = volume: 62 mL. 5.0 cm complex cystic mass with no significant internal vascularity. Mass contains multiple thin septations versus reticular pattern. This may represent a hemorrhagic cyst or endometrioma and less likely neoplasm. Left ovary Not visualized due to its high lateral position as seen on CT. Pulsed Doppler evaluation of the right ovary demonstrates normal low-resistance arterial and venous waveforms. Left ovary not visualized. Other findings No abnormal free fluid. IMPRESSION: 1. Normal size uterus with 5.7 cm fibroid over the posterior lower uterine segment. 2. 5.0 cm complex cystic right ovarian mass likely a hemorrhagic cyst or endometrioma and less likely neoplasm. Normal right ovarian blood flow without torsion. Recommend follow-up ultrasound 6 weeks. If unchanged at follow-up, would recommend gyn consultation and pelvic MRI for further evaluation. 3. Left ovary not visualized due to high lateral positioning. Therefore 2.6 cm hypodense mass seen on CT is not evaluated. Electronically Signed   By: Marin Olp M.D.   On: 12/11/2021 11:08    CT Abdomen Pelvis W Contrast  Result Date: 12/11/2021 CLINICAL DATA:  Urinary frequency and pain with urination. Pain radiating to right abdomen and flank. EXAM: CT ABDOMEN AND PELVIS WITH CONTRAST TECHNIQUE: Multidetector CT imaging of the abdomen and pelvis was performed using the standard protocol following bolus administration of intravenous contrast. RADIATION DOSE REDUCTION: This exam was performed according to the departmental dose-optimization program which includes automated exposure control, adjustment of the mA and/or kV according to patient size and/or use of iterative reconstruction technique. CONTRAST:  1103m OMNIPAQUE IOHEXOL 300 MG/ML  SOLN COMPARISON:  None Available. FINDINGS: Lower chest: There are patchy consolidative and ground-glass opacities in both lower lobes. There is more nodular appearing density in the right middle lobe measuring up to 1.1 cm. The imaged heart is unremarkable. Hepatobiliary: The liver and gallbladder are unremarkable. There is no biliary ductal dilatation. Pancreas: Unremarkable. Spleen: Unremarkable. Adrenals/Urinary Tract: Adrenals are unremarkable. There is a subcentimeter hypodense lesion in the right upper pole which is too small to characterize but requires no specific imaging follow-up. There are  no other focal renal lesions. There are no stones. There is no hydronephrosis or hydroureter. The kidneys enhance symmetrically. The bladder is decompressed but demonstrates circumferential wall thickening and mucosal hyperemia. Stomach/Bowel: The stomach is unremarkable. There is no evidence of bowel obstruction. There is no abnormal bowel wall thickening or inflammatory change. There is a moderate stool burden in the colon. The appendix is not definitively identified; however, there is no pericecal inflammatory change. Vascular/Lymphatic: The abdominal aorta is normal in course and caliber. The major branch vessels are patent. The main portal and splenic veins are  patent. There is no abdominopelvic lymphadenopathy. Reproductive: The uterus is enlarged and heterogeneous with a dominant fibroid posteriorly measuring up to 6.6 cm and an additional partially exophytic fundal fibroid measuring up to 2.7 cm. There is a 4.6 cm hypodense right adnexal lesion. There is no left adnexal mass. Other: There is no ascites or free air. Musculoskeletal: There is no acute osseous abnormality or suspicious osseous lesion. IMPRESSION: 1. Bladder wall thickening and mucosal hyperemia may be exaggerated by underdistention but could also reflect cystitis. Correlate with symptoms and urinalysis. 2. 4.6 cm hypodense right adnexal lesion. In the setting of right-sided pain, recommend pelvic ultrasound for further evaluation. 3. Fibroid uterus. 4. Patchy consolidative and ground-glass opacities in the imaged lung bases may reflect multifocal infection in the correct clinical setting. There is more nodular appearing density in the right middle lobe measuring up to 1.1 cm. Per Fleischner guidelines, recommend nonemergent dedicated chest CT for further evaluation of the findings. Electronically Signed   By: Valetta Mole M.D.   On: 12/11/2021 09:02    Procedures Procedures    Medications Ordered in ED Medications  sodium chloride (PF) 0.9 % injection (  Not Given 12/11/21 1137)  iohexol (OMNIPAQUE) 300 MG/ML solution 100 mL (100 mLs Intravenous Contrast Given 12/11/21 0834)    ED Course/ Medical Decision Making/ A&P Clinical Course as of 12/11/21 1234  Tue Dec 11, 2021  1123 I-Stat beta hCG blood, ED Pregnancy test negative, doubt ectopic pregnancy. [BM]  1123 Lipase, blood Lipase normal limits, doubt pancreatitis [BM]  1123 CBC CBC without leukocytosis suggest infectious process, no anemia or thrombocytopenia [BM]  1123 Comprehensive metabolic panel(!) CMP without emergent electrolyte derangement, AKI, LFT elevations or gap [BM]  1123 Urinalysis, Routine w reflex  microscopic(!) Urinalysis shows leukocytes, 6/10 WBCs and rare bacteria.  Nitrite negative.  Yeast present. [BM]  1124 CT Abdomen Pelvis W Contrast I have personally reviewed and interpreted patient's CT abdomen pelvis, I agree with radiologist patient has a right adnexal cyst.  I do not appreciate any obvious SBO or kidney stone disease.   [BM]  1125 US Transvaginal Non-OB I have reviewed radiology interpretation of pelvic ultrasound [BM]    Clinical Course User Index [BM] Deliah Boston, PA-C                           Medical Decision Making 41 year old female presented for 1 day of dysuria and right lower quadrant abdominal pain that developed overnight.  On exam she is well-appearing in no acute distress.  She is point tender in the right lower quadrant with positive McBurney's point.  She denies medical discharge/bleeding.  Abdominal pain labs have been ordered.  Given her point tenderness in the right lower quadrant I discussed CT imaging with the patient and her husband, risk versus benefits were discussed.  Patient elected to proceed with CT imaging to evaluate  for appendicitis or other acute pathologies.  Differential at this time includes but not limited to UTI, pyelonephritis, appendicitis, kidney stone disease, ectopic, torsion.  Amount and/or Complexity of Data Reviewed Independent Historian: spouse    Details: Patient's husband at bedside Labs: ordered. Decision-making details documented in ED Course. Radiology: ordered. Decision-making details documented in ED Course.  Risk Prescription drug management. Risk Details: Patient was reevaluated multiple times during this visit she appears well-appearing and in no acute distress vital signs stable.  CT scan today showed bladder wall thickening which could reflect cystitis, given her symptoms and UA favor this to be cystitis at this point.  We will treat with p.o. Keflex, will obtain a urine culture.  Her right adnexal cyst was  further evaluated with ultrasound today, this showed right ovarian mass likely hemorrhagic cyst or endometrioma, radiologist recommended follow-up ultrasound in 6 weeks, will have patient follow-up with her OB/GYN for this.  As the patient's groundglass opacities seen on CT scan she has no symptoms to suggest pneumonia at this time, COVID/influenza panel is pending.  Patient was advised to follow-up with PCP for chest CT scan for evaluation of a nodule in the right middle lobe.   I discussed findings above with attending physician Dr. Dennis Bast who agrees with plan to discharge with Keflex for cystitis and outpatient follow-up for above radiology findings. -- I reassessed the patient at discharge she is sitting up in bed fully dressed no acute distress.  Patient states understanding of findings above and she plans to follow-up with her OB/GYN and primary care provider for further management.  Patient aware that her COVID and flu test are still pending she will check up on those results on her MyChart account when they are available today.  I did discuss the yeast seen on patient's urinalysis, patient declined need for pelvic exam today she states no concerns for STI encouraged her to follow-up with her PCP in the next week for further treatment.  Low suspicion for PID or other emergent causes of her pain at this time.  Patient appears appropriate for outpatient treatment.  Incidentally patient is hypertensive in the ER today patient courage to follow-up with PCP for recheck.  No signs/symptoms to suggest hypertensive urgency/emergency.  At this time there does not appear to be any evidence of an acute emergency medical condition and the patient appears stable for discharge with appropriate outpatient follow up. Diagnosis was discussed with patient who verbalizes understanding of care plan and is agreeable to discharge. I have discussed return precautions with patient and husband who verbalizes understanding.  Patient encouraged to follow-up with their PCP. All questions answered.  Patient's case discussed with Dr. Armandina Gemma who agrees with plan to discharge with follow-up.   Note: Portions of this report may have been transcribed using voice recognition software. Every effort was made to ensure accuracy; however, inadvertent computerized transcription errors may still be present.         Final Clinical Impression(s) / ED Diagnoses Final diagnoses:  Acute cystitis with hematuria  Elevated blood pressure reading  Ovarian mass, right  Fibroids  Lung nodule    Rx / DC Orders ED Discharge Orders          Ordered    cephALEXin (KEFLEX) 500 MG capsule  2 times daily        12/11/21 1221              Gari Crown 12/11/21 1234    Regan Lemming, MD  12/11/21 1832  

## 2021-12-11 NOTE — ED Triage Notes (Signed)
Patient presents with urinary frequent and pain with urination. Pain radiates to the right side of the abdomen and flank.

## 2021-12-13 LAB — URINE CULTURE: Culture: >=100000 — AB

## 2021-12-14 ENCOUNTER — Telehealth (HOSPITAL_BASED_OUTPATIENT_CLINIC_OR_DEPARTMENT_OTHER): Payer: Self-pay

## 2021-12-14 NOTE — Telephone Encounter (Signed)
Post ED Visit - Positive Culture Follow-up  Culture report reviewed by antimicrobial stewardship pharmacist: [place] Team '[]'$  Elenor Quinones, Pharm.D. '[]'$  Heide Guile, Pharm.D., BCPS AQ-ID '[]'$  Parks Neptune, Pharm.D., BCPS '[]'$  Alycia Rossetti, Pharm.D., BCPS '[]'$  [place], Pharm.D., BCPS, AAHIVP '[]'$  Legrand Como, Pharm.D., BCPS, AAHIVP '[]'$  Salome Arnt, PharmD, BCPS '[]'$  Johnnette Gourd, PharmD, BCPS '[]'$  Hughes Better, PharmD, BCPS '[]'$  Leeroy Cha, PharmD '[]'$  Laqueta Linden, PharmD, BCPS '[]'$  Albertina Parr, PharmD  [place] Team '[x]'$  Jimmy Footman, PharmD '[]'$  Lindell Spar, PharmD '[]'$  Royetta Asal, PharmD '[]'$  Graylin Shiver, Rph '[]'$  Rema Fendt) Glennon Mac, PharmD '[]'$  Arlyn Dunning, PharmD '[]'$  Netta Cedars, PharmD '[]'$  Dia Sitter, PharmD '[]'$  Leone Haven, PharmD '[]'$  Gretta Arab, PharmD '[]'$  Theodis Shove, PharmD '[]'$  Peggyann Juba, PharmD '[]'$  Reuel Boom, PharmD   Positive urine culture Treated with Cephalexin, organism sensitive to the same and no further patient follow-up is required at this time.  Glennon Hamilton 12/14/2021, 12:27 PM
